# Patient Record
Sex: Female | Born: 1995 | Race: Black or African American | Hispanic: No | Marital: Single | Smoking: Never smoker
Health system: Southern US, Community
[De-identification: ages and names within clinical notes are randomized; demographics above are authoritative.]

## PROBLEM LIST (undated history)

## (undated) DIAGNOSIS — R7303 Prediabetes: Secondary | ICD-10-CM

## (undated) DIAGNOSIS — N911 Secondary amenorrhea: Secondary | ICD-10-CM

## (undated) DIAGNOSIS — E669 Obesity, unspecified: Secondary | ICD-10-CM

## (undated) HISTORY — DX: Obesity, unspecified: E66.9

## (undated) HISTORY — PX: TONSILLECTOMY AND ADENOIDECTOMY: SUR1326

## (undated) HISTORY — DX: Secondary amenorrhea: N91.1

## (undated) HISTORY — DX: Prediabetes: R73.03

---
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
## 2014-03-11 ENCOUNTER — Encounter (HOSPITAL_COMMUNITY): Payer: Self-pay | Admitting: *Deleted

## 2014-03-11 ENCOUNTER — Emergency Department (HOSPITAL_COMMUNITY)
Admission: EM | Admit: 2014-03-11 | Discharge: 2014-03-11 | Disposition: A | Discharge status: Home / Self Care | Payer: Medicaid Other | Attending: Emergency Medicine | Admitting: Emergency Medicine

## 2014-03-11 DIAGNOSIS — Y998 Other external cause status: Secondary | ICD-10-CM | POA: Insufficient documentation

## 2014-03-11 DIAGNOSIS — S8991XA Unspecified injury of right lower leg, initial encounter: Secondary | ICD-10-CM | POA: Insufficient documentation

## 2014-03-11 DIAGNOSIS — S3991XA Unspecified injury of abdomen, initial encounter: Secondary | ICD-10-CM | POA: Insufficient documentation

## 2014-03-11 DIAGNOSIS — Z793 Long term (current) use of hormonal contraceptives: Secondary | ICD-10-CM | POA: Insufficient documentation

## 2014-03-11 DIAGNOSIS — E669 Obesity, unspecified: Secondary | ICD-10-CM | POA: Insufficient documentation

## 2014-03-11 DIAGNOSIS — Y9389 Activity, other specified: Secondary | ICD-10-CM | POA: Insufficient documentation

## 2014-03-11 DIAGNOSIS — Z8742 Personal history of other diseases of the female genital tract: Secondary | ICD-10-CM | POA: Insufficient documentation

## 2014-03-11 DIAGNOSIS — Y92410 Unspecified street and highway as the place of occurrence of the external cause: Secondary | ICD-10-CM | POA: Insufficient documentation
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
## 2014-03-11 NOTE — Discharge Instructions (Signed)
Tylenol or motrin for pain.  Follow up as needed °
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
## 2016-02-13 DIAGNOSIS — Z309 Encounter for contraceptive management, unspecified: Secondary | ICD-10-CM | POA: Diagnosis not present

## 2016-02-13 DIAGNOSIS — Z6841 Body Mass Index (BMI) 40.0 and over, adult: Secondary | ICD-10-CM | POA: Diagnosis not present

## 2017-03-14 DIAGNOSIS — Z23 Encounter for immunization: Secondary | ICD-10-CM | POA: Diagnosis not present

## 2017-07-16 DIAGNOSIS — Z0000 Encounter for general adult medical examination without abnormal findings: Secondary | ICD-10-CM | POA: Diagnosis not present

## 2017-07-16 DIAGNOSIS — Z6841 Body Mass Index (BMI) 40.0 and over, adult: Secondary | ICD-10-CM | POA: Diagnosis not present

## 2017-11-19 DIAGNOSIS — Z0001 Encounter for general adult medical examination with abnormal findings: Secondary | ICD-10-CM | POA: Diagnosis not present

## 2017-11-19 DIAGNOSIS — Z0000 Encounter for general adult medical examination without abnormal findings: Secondary | ICD-10-CM | POA: Diagnosis not present

## 2019-03-18 DIAGNOSIS — Z20828 Contact with and (suspected) exposure to other viral communicable diseases: Secondary | ICD-10-CM | POA: Diagnosis not present

## 2019-06-01 ENCOUNTER — Ambulatory Visit: Payer: 59 | Attending: Internal Medicine

## 2019-06-01 ENCOUNTER — Other Ambulatory Visit: Payer: Self-pay

## 2019-06-01 DIAGNOSIS — Z20822 Contact with and (suspected) exposure to covid-19: Secondary | ICD-10-CM | POA: Insufficient documentation

## 2019-06-02 LAB — NOVEL CORONAVIRUS, NAA: SARS-CoV-2, NAA: NOT DETECTED

## 2019-10-16 ENCOUNTER — Ambulatory Visit
Admission: EM | Admit: 2019-10-16 | Discharge: 2019-10-16 | Disposition: A | Discharge status: Home / Self Care | Payer: 59 | Attending: Emergency Medicine | Admitting: Emergency Medicine

## 2019-10-16 ENCOUNTER — Encounter: Payer: Self-pay | Admitting: Emergency Medicine

## 2019-10-16 ENCOUNTER — Other Ambulatory Visit: Payer: Self-pay

## 2019-10-16 DIAGNOSIS — L0882 Omphalitis not of newborn: Secondary | ICD-10-CM | POA: Diagnosis present

## 2019-10-16 MED ORDER — VANCOMYCIN HCL 125 MG PO CAPS: 125.0000 mg | ORAL_CAPSULE | Freq: Four times a day (QID) | ORAL | 0 refills | Status: DC

## 2019-10-16 NOTE — ED Triage Notes (Signed)
Yellow/white/ greenish drainage from umbilicus x 4days.  Pt has used some antifungal cream and peroxide and it has not gotten any better.

## 2019-10-16 NOTE — ED Provider Notes (Signed)
[facility]   [id number] 10/16/19 Arrival Time: 1101   Chief Complaint  Patient presents with  . Abscess     SUBJECTIVE:  Joyce Jordan is a 24 y.o. female who presents the urgent care with a complaint of white drainage from umbilicus for the past 4 days.  Denies any precipitating event.  Has tried OTC antifungal cream and peroxide without relief.  Nothing make her symptoms worse.  Denies similar symptoms in the past.  Denies chills, fever, nausea, vomiting, diarrhea.    ROS: As per HPI.  All other pertinent ROS negative.     Past Medical History:  Diagnosis Date  . Obesity   . Prediabetes   . Secondary amenorrhea    Past Surgical History:  Procedure Laterality Date  . TONSILLECTOMY AND ADENOIDECTOMY     No Known Allergies No current facility-administered medications on file prior to encounter.   Current Outpatient Medications on File Prior to Encounter  Medication Sig Dispense Refill  . medroxyPROGESTERone (PROVERA) 10 MG tablet Take 1 tablet (10 mg total) by mouth daily. 1 pill daily x 14 days. 14 tablet 0   Social History   Socioeconomic History  . Marital status: Single    Spouse name: Not on file  . Number of children: Not on file  . Years of education: Not on file  . Highest education level: Not on file  Occupational History  . Not on file  Tobacco Use  . Smoking status: Passive Smoke Exposure - Never Smoker  . Smokeless tobacco: Never Used  Substance and Sexual Activity  . Alcohol use: No  . Drug use: No  . Sexual activity: Yes    Birth control/protection: None  Other Topics Concern  . Not on file  Social History Narrative   Is in 12th grade at [employer]. Lives with dad, grandmother   Social Determinants of Health   Financial Resource Strain:   . Difficulty of Paying Living Expenses:   Food Insecurity:   . Worried About Programme researcher, broadcasting/film/video in the Last Year:   . Barista in the Last Year:   Transportation Needs:     . Freight forwarder (Medical):   Marland Kitchen Lack of Transportation (Non-Medical):   Physical Activity:   . Days of Exercise per Week:   . Minutes of Exercise per Session:   Stress:   . Feeling of Stress :   Social Connections:   . Frequency of Communication with Friends and Family:   . Frequency of Social Gatherings with Friends and Family:   . Attends Religious Services:   . Active Member of Clubs or Organizations:   . Attends Banker Meetings:   Marland Kitchen Marital Status:   Intimate Partner Violence:   . Fear of Current or Ex-Partner:   . Emotionally Abused:   Marland Kitchen Physically Abused:   . Sexually Abused:    Family History  Problem Relation Age of Onset  . Thyroid disease Mother   . Hypertension Mother   . Obesity Mother   . Obesity Paternal Grandmother   . Obesity Father     OBJECTIVE:  Vitals:   10/16/19 1119 10/16/19 1121  BP:  (!) 140/93  Pulse:  (!) 101  Resp:  19  Temp:  98.9 F (37.2 C)  TempSrc:  Oral  SpO2:  97%  Weight: 280 lb (127 kg)   Height: 5\' 2"  (1.575 m)      Physical Exam Vitals and nursing note  reviewed.  Constitutional:      General: She is not in acute distress.    Appearance: Normal appearance. She is obese. She is not ill-appearing or toxic-appearing.  Cardiovascular:     Rate and Rhythm: Normal rate and regular rhythm.     Pulses: Normal pulses.     Heart sounds: Normal heart sounds. No murmur heard.  No gallop.   Pulmonary:     Effort: Pulmonary effort is normal. No respiratory distress.     Breath sounds: Normal breath sounds. No stridor. No wheezing, rhonchi or rales.  Chest:     Chest wall: No tenderness.  Skin:    General: Skin is warm and dry.     Findings: No ecchymosis, erythema, lesion or rash. Rash is not crusting or scaling.     Comments: White drainage from umbilicus.  Patient is obese unable to perform an abdomen present as to visualize the origin of the drainage.  Neurological:     Mental Status: She is alert.     Procedure:   ASSESSMENT & PLAN:  1. Omphalitis in adult     Meds ordered this encounter  Medications  . vancomycin (VANCOCIN) 125 MG capsule    Sig: Take 1 capsule (125 mg total) by mouth 4 (four) times daily.    Dispense:  10 capsule    Refill:  0   Patient is stable at discharge.  Currently there is no fever, however there is a concern of sepsis.  She was given the option to go to ED patient declined this time.  Vancomycin was prescribed.  Wound culture was completed.  Discharge instructions Wash site daily with warm water and mild soap Apply thin layer of Neosporin with Q-tips Keep covered to avoid friction Take antibiotic as prescribed and to completion Follow up here or with PCP if symptoms persists Return or go to the ED if you have any new or worsening symptoms increased redness, swelling, pain, nausea, vomiting, fever, chills, low blood pressure, higher heart rate, sepsis etc...    Reviewed expectations re: course of current medical issues. Questions answered. Outlined signs and symptoms indicating need for more acute intervention. Patient verbalized understanding. After Visit Summary given.          Emerson Monte, [place] 10/16/19 1210

## 2019-10-16 NOTE — Discharge Instructions (Addendum)
Wash site daily with warm water and mild soap Apply thin layer of Neosporin with Q-tips Keep covered to avoid friction Take antibiotic as prescribed and to completion Follow up here or with PCP if symptoms persists Return or go to the ED if you have any new or worsening symptoms increased redness, swelling, pain, nausea, vomiting, fever, chills, low blood pressure, higher heart rate, sepsis etc..Marland Kitchen

## 2019-10-17 ENCOUNTER — Telehealth: Payer: Self-pay | Admitting: Emergency Medicine

## 2019-10-17 MED ORDER — VANCOMYCIN HCL 250 MG PO CAPS: 250.0000 mg | ORAL_CAPSULE | Freq: Four times a day (QID) | ORAL | 0 refills | Status: AC

## 2019-10-17 NOTE — Telephone Encounter (Signed)
Antibiotic was sent to complete treatment

## 2019-10-19 ENCOUNTER — Telehealth (HOSPITAL_COMMUNITY): Payer: Self-pay | Admitting: Orthopedic Surgery

## 2019-10-19 LAB — AEROBIC CULTURE W GRAM STAIN (SUPERFICIAL SPECIMEN)

## 2019-12-02 ENCOUNTER — Encounter: Payer: Self-pay | Admitting: Adult Health

## 2019-12-02 ENCOUNTER — Ambulatory Visit (INDEPENDENT_AMBULATORY_CARE_PROVIDER_SITE_OTHER): Payer: 59 | Admitting: Adult Health

## 2019-12-02 ENCOUNTER — Other Ambulatory Visit (HOSPITAL_COMMUNITY)
Admission: RE | Admit: 2019-12-02 | Discharge: 2019-12-02 | Disposition: A | Discharge status: Home / Self Care | Payer: 59 | Source: Ambulatory Visit | Attending: Adult Health | Admitting: Adult Health

## 2019-12-02 VITALS — BP 145/98 | HR 97 | Ht 62.0 in | Wt 286.0 lb

## 2019-12-02 DIAGNOSIS — Z01419 Encounter for gynecological examination (general) (routine) without abnormal findings: Secondary | ICD-10-CM

## 2019-12-02 DIAGNOSIS — I10 Essential (primary) hypertension: Secondary | ICD-10-CM | POA: Diagnosis not present

## 2019-12-02 DIAGNOSIS — Z3202 Encounter for pregnancy test, result negative: Secondary | ICD-10-CM

## 2019-12-02 DIAGNOSIS — Z30011 Encounter for initial prescription of contraceptive pills: Secondary | ICD-10-CM | POA: Insufficient documentation

## 2019-12-02 LAB — POCT URINE PREGNANCY: Preg Test, Ur: NEGATIVE

## 2019-12-02 MED ORDER — NORETHINDRONE 0.35 MG PO TABS: 1.0000 | ORAL_TABLET | Freq: Every day | ORAL | 11 refills | Status: DC

## 2019-12-02 MED ORDER — LOSARTAN POTASSIUM-HCTZ 50-12.5 MG PO TABS: 1.0000 | ORAL_TABLET | Freq: Every day | ORAL | 6 refills | Status: DC

## 2019-12-02 NOTE — Patient Instructions (Signed)
DASH Eating Plan DASH stands for "Dietary Approaches to Stop Hypertension." The DASH eating plan is a healthy eating plan that has been shown to reduce high blood pressure (hypertension). It may also reduce your risk for type 2 diabetes, heart disease, and stroke. The DASH eating plan may also help with weight loss. What are tips for following this plan?  General guidelines  Avoid eating more than 2,300 mg (milligrams) of salt (sodium) a day. If you have hypertension, you may need to reduce your sodium intake to 1,500 mg a day.  Limit alcohol intake to no more than 1 drink a day for nonpregnant women and 2 drinks a day for men. One drink equals 12 oz of beer, 5 oz of wine, or 1 oz of hard liquor.  Work with your health care provider to maintain a healthy body weight or to lose weight. Ask what an ideal weight is for you.  Get at least 30 minutes of exercise that causes your heart to beat faster (aerobic exercise) most days of the week. Activities may include walking, swimming, or biking.  Work with your health care provider or diet and nutrition specialist (dietitian) to adjust your eating plan to your individual calorie needs. Reading food labels   Check food labels for the amount of sodium per serving. Choose foods with less than 5 percent of the Daily Value of sodium. Generally, foods with less than 300 mg of sodium per serving fit into this eating plan.  To find whole grains, look for the word "whole" as the first word in the ingredient list. Shopping  Buy products labeled as "low-sodium" or "no salt added."  Buy fresh foods. Avoid canned foods and premade or frozen meals. Cooking  Avoid adding salt when cooking. Use salt-free seasonings or herbs instead of table salt or sea salt. Check with your health care provider or pharmacist before using salt substitutes.  Do not fry foods. Cook foods using healthy methods such as baking, boiling, grilling, and broiling instead.  Cook with  heart-healthy oils, such as olive, canola, soybean, or sunflower oil. Meal planning  Eat a balanced diet that includes: ? 5 or more servings of fruits and vegetables each day. At each meal, try to fill half of your plate with fruits and vegetables. ? Up to 6-8 servings of whole grains each day. ? Less than 6 oz of lean meat, poultry, or fish each day. A 3-oz serving of meat is about the same size as a deck of cards. One egg equals 1 oz. ? 2 servings of low-fat dairy each day. ? A serving of nuts, seeds, or beans 5 times each week. ? Heart-healthy fats. Healthy fats called Omega-3 fatty acids are found in foods such as flaxseeds and coldwater fish, like sardines, salmon, and mackerel.  Limit how much you eat of the following: ? Canned or prepackaged foods. ? Food that is high in trans fat, such as fried foods. ? Food that is high in saturated fat, such as fatty meat. ? Sweets, desserts, sugary drinks, and other foods with added sugar. ? Full-fat dairy products.  Do not salt foods before eating.  Try to eat at least 2 vegetarian meals each week.  Eat more home-cooked food and less restaurant, buffet, and fast food.  When eating at a restaurant, ask that your food be prepared with less salt or no salt, if possible. What foods are recommended? The items listed may not be a complete list. Talk with your dietitian about   what dietary choices are best for you. Grains Whole-grain or whole-wheat bread. Whole-grain or whole-wheat pasta. Brown rice. Oatmeal. Quinoa. Bulgur. Whole-grain and low-sodium cereals. Pita bread. Low-fat, low-sodium crackers. Whole-wheat flour tortillas. Vegetables Fresh or frozen vegetables (raw, steamed, roasted, or grilled). Low-sodium or reduced-sodium tomato and vegetable juice. Low-sodium or reduced-sodium tomato sauce and tomato paste. Low-sodium or reduced-sodium canned vegetables. Fruits All fresh, dried, or frozen fruit. Canned fruit in natural juice (without  added sugar). Meat and other protein foods Skinless chicken or turkey. Ground chicken or turkey. Pork with fat trimmed off. Fish and seafood. Egg whites. Dried beans, peas, or lentils. Unsalted nuts, nut butters, and seeds. Unsalted canned beans. Lean cuts of beef with fat trimmed off. Low-sodium, lean deli meat. Dairy Low-fat (1%) or fat-free (skim) milk. Fat-free, low-fat, or reduced-fat cheeses. Nonfat, low-sodium ricotta or cottage cheese. Low-fat or nonfat yogurt. Low-fat, low-sodium cheese. Fats and oils Soft margarine without trans fats. Vegetable oil. Low-fat, reduced-fat, or light mayonnaise and salad dressings (reduced-sodium). Canola, safflower, olive, soybean, and sunflower oils. Avocado. Seasoning and other foods Herbs. Spices. Seasoning mixes without salt. Unsalted popcorn and pretzels. Fat-free sweets. What foods are not recommended? The items listed may not be a complete list. Talk with your dietitian about what dietary choices are best for you. Grains Baked goods made with fat, such as croissants, muffins, or some breads. Dry pasta or rice meal packs. Vegetables Creamed or fried vegetables. Vegetables in a cheese sauce. Regular canned vegetables (not low-sodium or reduced-sodium). Regular canned tomato sauce and paste (not low-sodium or reduced-sodium). Regular tomato and vegetable juice (not low-sodium or reduced-sodium). Pickles. Olives. Fruits Canned fruit in a light or heavy syrup. Fried fruit. Fruit in cream or butter sauce. Meat and other protein foods Fatty cuts of meat. Ribs. Fried meat. Bacon. Sausage. Bologna and other processed lunch meats. Salami. Fatback. Hotdogs. Bratwurst. Salted nuts and seeds. Canned beans with added salt. Canned or smoked fish. Whole eggs or egg yolks. Chicken or turkey with skin. Dairy Whole or 2% milk, cream, and half-and-half. Whole or full-fat cream cheese. Whole-fat or sweetened yogurt. Full-fat cheese. Nondairy creamers. Whipped toppings.  Processed cheese and cheese spreads. Fats and oils Butter. Stick margarine. Lard. Shortening. Ghee. Bacon fat. Tropical oils, such as coconut, palm kernel, or palm oil. Seasoning and other foods Salted popcorn and pretzels. Onion salt, garlic salt, seasoned salt, table salt, and sea salt. Worcestershire sauce. Tartar sauce. Barbecue sauce. Teriyaki sauce. Soy sauce, including reduced-sodium. Steak sauce. Canned and packaged gravies. Fish sauce. Oyster sauce. Cocktail sauce. Horseradish that you find on the shelf. Ketchup. Mustard. Meat flavorings and tenderizers. Bouillon cubes. Hot sauce and Tabasco sauce. Premade or packaged marinades. Premade or packaged taco seasonings. Relishes. Regular salad dressings. Where to find more information:  National Heart, Lung, and Blood Institute: www.nhlbi.nih.gov  American Heart Association: www.heart.org Summary  The DASH eating plan is a healthy eating plan that has been shown to reduce high blood pressure (hypertension). It may also reduce your risk for type 2 diabetes, heart disease, and stroke.  With the DASH eating plan, you should limit salt (sodium) intake to 2,300 mg a day. If you have hypertension, you may need to reduce your sodium intake to 1,500 mg a day.  When on the DASH eating plan, aim to eat more fresh fruits and vegetables, whole grains, lean proteins, low-fat dairy, and heart-healthy fats.  Work with your health care provider or diet and nutrition specialist (dietitian) to adjust your eating plan to your   individual calorie needs. This information is not intended to replace advice given to you by your health care provider. Make sure you discuss any questions you have with your health care provider. Document Revised: 03/29/2017 Document Reviewed: 04/09/2016 Elsevier Patient Education  2020 Elsevier Inc.  

## 2019-12-02 NOTE — Progress Notes (Signed)
Patient ID: Joyce Jordan, female   DOB: 1995-07-25, 24 y.o.   MRN: [id number] History of Present Illness: Joyce Jordan is a 24 year old black female,single, G0P0 in for a pelvic and first pap,she had a physical in January with Dr Felecia Shelling. PCP is Dr Felecia Shelling    Current Medications, Allergies, Past Medical History, Past Surgical History, Family History and Social History were reviewed in [employer] electronic medical record.     Review of Systems: Patient denies any headaches, hearing loss, fatigue, blurred vision, shortness of breath, chest pain, abdominal pain, problems with bowel movements, urination, or intercourse. No joint pain or mood swings.    Physical Exam:BP (!) 145/98 (BP Location: Right Wrist, Patient Position: Sitting, Cuff Size: Normal)    Pulse 97    Ht 5\' 2"  (1.575 m)    Wt 286 lb (129.7 kg)    LMP 11/11/2019    BMI 52.31 kg/m   BP was 137/108 in January at Dr February.  UPT is negative. General:  Well developed, well nourished, no acute distress Skin:  Warm and dry Neck:  Midline trachea, normal thyroid, good ROM, no lymphadenopathy Lungs; Clear to auscultation bilaterally Cardiovascular: Regular rate and rhythm Pelvic:  External genitalia is normal in appearance, no lesions.  The vagina is normal in appearance. Urethra has no lesions or masses. The cervix is nulliparous, pap with GC/CHL and high risk HPV genotyping performed.  Uterus is felt to be normal size, shape, and contour.  No adnexal masses or tenderness noted.Bladder is non tender, no masses felt. Extremities/musculoskeletal:  No swelling or varicosities noted, no clubbing or cyanosis Psych:  No mood changes, alert and cooperative,seems happy AA is 0 Fall risk is low PHQ 9 score is 3, no SI    Upstream - 12/02/19 02/01/20      Pregnancy Intention Screening   Does the patient want to become pregnant in the next year? No    Does the patient's partner want to become pregnant in the next year? No    Would the patient  like to discuss contraceptive options today? Yes      Contraception Wrap Up   Current Method No Method - Other Reason    End Method Oral Contraceptive    Contraception Counseling Provided Yes          Examination chaperoned by [id number] CMA.  Impression and Plan: 1. Urine pregnancy test negative   2. Encounter for gynecological examination with Papanicolaou smear of cervix Pap sent Physical with PCP and labs  Pap in 3 years if normal  3. Encounter for initial prescription of contraceptive pills Will rx POP due to BP,can start today,take at same time every day and use condoms for at least 1 pack Meds ordered this encounter  Medications   norethindrone (MICRONOR) 0.35 MG tablet    Sig: Take 1 tablet (0.35 mg total) by mouth daily.    Dispense:  28 tablet    Refill:  11    Order Specific Question:   Supervising Provider    Answer:   Federico Flake H [[id number]]   losartan-hydrochlorothiazide (HYZAAR) 50-12.5 MG tablet    Sig: Take 1 tablet by mouth daily.    Dispense:  30 tablet    Refill:  6    Order Specific Question:   Supervising Provider    Answer:   Duane Lope, LUTHER H [[id number]]    4. Hypertension, unspecified type Will rx hyzaar for BP  Discussed decreasing salt and sugars,review DASH diet Follow  up in 9 weeks for BP and weight check  5. Morbid obesity (HCC) Discussed trying to lose weight, cut out bread, potatoes, rice and pasta She ask about gastric by pass aware of [facility] in [place].

## 2019-12-04 LAB — CYTOLOGY - PAP
Chlamydia: POSITIVE — AB
Comment: NEGATIVE
Comment: NEGATIVE
Comment: NORMAL
Diagnosis: NEGATIVE
High risk HPV: NEGATIVE
Neisseria Gonorrhea: NEGATIVE

## 2019-12-07 ENCOUNTER — Encounter: Payer: Self-pay | Admitting: Adult Health

## 2019-12-07 ENCOUNTER — Telehealth: Payer: Self-pay | Admitting: Adult Health

## 2019-12-07 DIAGNOSIS — A749 Chlamydial infection, unspecified: Secondary | ICD-10-CM

## 2019-12-07 HISTORY — DX: Chlamydial infection, unspecified: A74.9

## 2019-12-07 MED ORDER — AZITHROMYCIN 500 MG PO TABS: ORAL_TABLET | ORAL | 0 refills | Status: DC

## 2019-12-07 NOTE — Telephone Encounter (Signed)
Pt aware pap negative for malignancy and HPV and GC but +chlamydia  Will treat with azithromycin 500 mg # 2 2 po now, no sex na PCO in 4 weeks [facility] sent Pt says no sex in over a year, contact person to be treated if can

## 2020-01-05 ENCOUNTER — Other Ambulatory Visit (HOSPITAL_COMMUNITY)
Admission: RE | Admit: 2020-01-05 | Discharge: 2020-01-05 | Disposition: A | Discharge status: Home / Self Care | Payer: 59 | Source: Ambulatory Visit | Attending: Adult Health | Admitting: Adult Health

## 2020-01-05 ENCOUNTER — Other Ambulatory Visit: Payer: Self-pay

## 2020-01-05 ENCOUNTER — Ambulatory Visit (INDEPENDENT_AMBULATORY_CARE_PROVIDER_SITE_OTHER): Payer: 59 | Admitting: Adult Health

## 2020-01-05 ENCOUNTER — Encounter: Payer: Self-pay | Admitting: Adult Health

## 2020-01-05 VITALS — BP 133/91 | HR 93 | Ht 62.0 in | Wt 284.0 lb

## 2020-01-05 DIAGNOSIS — I10 Essential (primary) hypertension: Secondary | ICD-10-CM

## 2020-01-05 DIAGNOSIS — Z3041 Encounter for surveillance of contraceptive pills: Secondary | ICD-10-CM | POA: Diagnosis not present

## 2020-01-05 DIAGNOSIS — A749 Chlamydial infection, unspecified: Secondary | ICD-10-CM

## 2020-01-05 MED ORDER — NORETHIN ACE-ETH ESTRAD-FE 1-20 MG-MCG PO TABS: 1.0000 | ORAL_TABLET | Freq: Every day | ORAL | 11 refills | Status: DC

## 2020-01-05 NOTE — Progress Notes (Signed)
°  Subjective:     Patient ID: Joyce Jordan, female   DOB: [birth year], 24 y.o.   MRN: [id number]  HPI Joyce Jordan is a 24 year old black female,single, G0P0 in for proof of cure, had +chlamydia on pap. No sex since treatment. She wants to go back on COCs, BP is better.  PCP is Dr Felecia Shelling   Review of Systems Denies any vaginal discharge, itching or odor. Reviewed past medical,surgical, social and family history. Reviewed medications and allergies.     Objective:   Physical Exam BP (!) 133/91 (BP Location: Left Arm, Patient Position: Sitting, Cuff Size: Normal)    Pulse 93    Ht 5\' 2"  (1.575 m)    Wt 284 lb (128.8 kg)    LMP 12/06/2019    BMI 51.94 kg/m  Skin warm and dry.Pelvic: external genitalia is normal in appearance no lesions, vagina: scant discharge without odor,urethra has no lesions or masses noted, cervix:smooth, uterus: normal size, shape and contour, non tender, no masses felt, adnexa: no masses or tenderness noted. Bladder is non tender and no masses felt. CV swab obtained for GC/CHL. Examination chaperoned by 02/05/2020 LPN     Upstream - 01/05/20 0949      Pregnancy Intention Screening   Does the patient want to become pregnant in the next year? No    Does the patient's partner want to become pregnant in the next year? No    Would the patient like to discuss contraceptive options today? Yes      Contraception Wrap Up   Current Method Female Condom    End Method Oral Contraceptive    Contraception Counseling Provided Yes          Assessment:     1. Chlamydia CV swab sent   2. Hypertension, unspecified type Continue hyzaar, has refills  3. Morbid obesity (HCC) Try to lose a pound a week  4. Encounter for surveillance of contraceptive pills Finish current pack of OCs, then start junel 1/20, use condoms   Meds ordered this encounter  Medications   norethindrone-ethinyl estradiol (LOESTRIN FE) 1-20 MG-MCG tablet    Sig: Take 1 tablet by mouth daily.     Dispense:  28 tablet    Refill:  11    Order Specific Question:   Supervising Provider    Answer:   2/20 [[id number]]      Plan:     Follow up in 3 months of OCs and BP

## 2020-01-06 LAB — CERVICOVAGINAL ANCILLARY ONLY
Chlamydia: NEGATIVE
Comment: NEGATIVE
Comment: NORMAL
Neisseria Gonorrhea: NEGATIVE

## 2020-02-03 ENCOUNTER — Ambulatory Visit: Payer: 59 | Admitting: Adult Health

## 2020-02-11 ENCOUNTER — Encounter: Payer: Self-pay | Admitting: Adult Health

## 2020-02-11 ENCOUNTER — Ambulatory Visit (INDEPENDENT_AMBULATORY_CARE_PROVIDER_SITE_OTHER): Payer: 59 | Admitting: Adult Health

## 2020-02-11 VITALS — BP 131/83 | HR 88 | Ht 62.0 in | Wt 284.0 lb

## 2020-02-11 DIAGNOSIS — I10 Essential (primary) hypertension: Secondary | ICD-10-CM | POA: Diagnosis not present

## 2020-02-11 DIAGNOSIS — N926 Irregular menstruation, unspecified: Secondary | ICD-10-CM | POA: Diagnosis not present

## 2020-02-11 NOTE — Progress Notes (Signed)
  Subjective:     Patient ID: Joyce Jordan, female   DOB: [birth year], 24 y.o.   MRN: [id number]  HPI Joyce Jordan is a 24 year old black female,single, G0P0, in for BP and weight check. She has been to [facility].  PCP is Dr Felecia Shelling   Review of Systems Period is late Reviewed past medical,surgical, social and family history. Reviewed medications and allergies.     Objective:   Physical Exam BP 131/83 (BP Location: Left Arm, Patient Position: Sitting, Cuff Size: Normal)   Pulse 88   Ht 5\' 2"  (1.575 m)   Wt 284 lb (128.8 kg)   LMP 01/08/2020 (Approximate)   BMI 51.94 kg/m  Skin warm and dry. Lungs: clear to ausculation bilaterally. Cardiovascular: regular rate and rhythm.  Upstream - 02/11/20 1220      Pregnancy Intention Screening   Does the patient want to become pregnant in the next year? No    Would the patient like to discuss contraceptive options today? No      Contraception Wrap Up   Current Method Oral Contraceptive    End Method Oral Contraceptive    Contraception Counseling Provided No             Assessment:     1. Hypertension, unspecified type Continue Hyzaar 50-12.5 mg,has refills  2. Morbid obesity (HCC) Keep trying to lose weight  Walk more   3. Menstrual period late Continue OCs, has refills     Plan:     Follow up in 3 months for BP and weight check

## 2020-04-05 ENCOUNTER — Ambulatory Visit (INDEPENDENT_AMBULATORY_CARE_PROVIDER_SITE_OTHER): Payer: 59 | Admitting: Adult Health

## 2020-04-05 ENCOUNTER — Other Ambulatory Visit: Payer: Self-pay

## 2020-04-05 ENCOUNTER — Encounter: Payer: Self-pay | Admitting: Adult Health

## 2020-04-05 VITALS — BP 118/75 | HR 85 | Ht 63.0 in | Wt 284.0 lb

## 2020-04-05 DIAGNOSIS — Z3041 Encounter for surveillance of contraceptive pills: Secondary | ICD-10-CM

## 2020-04-05 DIAGNOSIS — I10 Essential (primary) hypertension: Secondary | ICD-10-CM

## 2020-04-05 NOTE — Progress Notes (Signed)
  Subjective:     Patient ID: Joyce Jordan, female   DOB: 1995/07/16, 24 y.o.   MRN: [id number]  HPI Joyce Jordan is a 24 year old black female,single, G0P0, back in follow up on BP and weight. She did go to weight loss seminar, but has not heard from clinic yet. PCP is Dr Felecia Shelling.  Review of Systems Patient denies any headaches, hearing loss, fatigue, blurred vision, shortness of breath, chest pain, abdominal pain, problems with bowel movements, urination, or intercourse(not active). No joint pain or mood swings. Periods regular on OCs.    Reviewed past medical,surgical, social and family history. Reviewed medications and allergies.  Objective:   Physical Exam BP 118/75 (BP Location: Left Arm, Patient Position: Sitting, Cuff Size: Large)   Pulse 85   Ht 5\' 3"  (1.6 m)   Wt 284 lb (128.8 kg)   LMP 03/16/2020   BMI 50.31 kg/m  Skin warm and dry.Lungs: clear to ausculation bilaterally. Cardiovascular: regular rate and rhythm. Her BP is much better and did not gain and weight same as 3 months ago.   Upstream - 04/05/20 0920      Pregnancy Intention Screening   Does the patient want to become pregnant in the next year? No    Does the patient's partner want to become pregnant in the next year? No    Would the patient like to discuss contraceptive options today? No      Contraception Wrap Up   Current Method Oral Contraceptive    End Method Oral Contraceptive    Contraception Counseling Provided No             Assessment:     1. Hypertension, unspecified type Continue Hyzaar, has refills   2. Morbid obesity (HCC) Call weight loss clinic back  3. Encounter for surveillance of contraceptive pills Continue Loestrin 1-20 has refills     Plan:     Follow up in 3 months for BP check and ROS on weight loss efforts

## 2020-06-23 ENCOUNTER — Other Ambulatory Visit: Payer: Self-pay | Admitting: Adult Health

## 2020-07-07 ENCOUNTER — Ambulatory Visit: Payer: 59 | Admitting: Adult Health

## 2020-07-28 ENCOUNTER — Other Ambulatory Visit: Payer: Self-pay

## 2020-07-28 ENCOUNTER — Other Ambulatory Visit: Payer: Self-pay | Admitting: Adult Health

## 2020-07-28 ENCOUNTER — Encounter: Payer: Self-pay | Admitting: Adult Health

## 2020-07-28 ENCOUNTER — Ambulatory Visit (INDEPENDENT_AMBULATORY_CARE_PROVIDER_SITE_OTHER): Payer: 59 | Admitting: Adult Health

## 2020-07-28 VITALS — BP 125/92 | HR 87 | Ht 62.0 in | Wt 285.5 lb

## 2020-07-28 DIAGNOSIS — I10 Essential (primary) hypertension: Secondary | ICD-10-CM

## 2020-07-28 DIAGNOSIS — Z3041 Encounter for surveillance of contraceptive pills: Secondary | ICD-10-CM

## 2020-07-28 MED ORDER — LOSARTAN POTASSIUM-HCTZ 50-12.5 MG PO TABS: ORAL_TABLET | ORAL | 1 refills | Status: DC

## 2020-07-28 NOTE — Progress Notes (Signed)
  Subjective:     Patient ID: Joyce Jordan, female   DOB: October 03, 1995, 25 y.o.   MRN: [id number]  HPI Joyce Jordan is a 25 year old black female,single, G0P0 back in follow up on weight and BP and periods. She stopped BP last week BP was low, and PCP told her to check BP before taking her BP pill. Periods are good, and has not heard back from Weight loss center. Last pap was negative for malignancy 12/22/19.   PCP is Dr Felecia Shelling.  Review of Systems Periods good BP has been low. Reviewed past medical,surgical, social and family history. Reviewed medications and allergies.     Objective:   Physical Exam BP (!) 125/92 (BP Location: Left Arm, Patient Position: Sitting, Cuff Size: Large)   Pulse 87   Ht 5\' 2"  (1.575 m)   Wt 285 lb 8 oz (129.5 kg)   LMP 07/08/2020 (Approximate)   BMI 52.22 kg/m  Skin warm and dry. Lungs: clear to ausculation bilaterally. Cardiovascular: regular rate and rhythm. She gained 1.5 lbs Fall risk is low  Upstream - 07/28/20 1149      Pregnancy Intention Screening   Does the patient want to become pregnant in the next year? No    Does the patient's partner want to become pregnant in the next year? No    Would the patient like to discuss contraceptive options today? No      Contraception Wrap Up   Current Method Oral Contraceptive    End Method Oral Contraceptive    Contraception Counseling Provided No             Assessment:     1. Hypertension, unspecified type Will try 1/2 tablet hyzaar,and keep check on BP Meds ordered this encounter  Medications  . losartan-hydrochlorothiazide (HYZAAR) 50-12.5 MG tablet    Sig: Take 1/2 tablet in am    Dispense:  30 tablet    Refill:  1    ZERO refills remain on this prescription. Your patient is requesting advance approval of refills for this medication to PREVENT ANY MISSED DOSES    Order Specific Question:   Supervising Provider    Answer:   07/30/20, LUTHER H [[id number]]    2. Morbid obesity (HCC) Call weight loss  center  3. Encounter for surveillance of contraceptive pills Continue OCs    Plan:     Follow up in 8 weeks

## 2020-09-16 ENCOUNTER — Other Ambulatory Visit: Payer: Self-pay | Admitting: Adult Health

## 2020-09-22 ENCOUNTER — Ambulatory Visit: Payer: 59 | Admitting: Adult Health

## 2020-10-11 ENCOUNTER — Ambulatory Visit: Payer: Self-pay | Admitting: "Endocrinology

## 2020-10-15 ENCOUNTER — Other Ambulatory Visit: Payer: Self-pay | Admitting: Adult Health

## 2020-12-19 ENCOUNTER — Other Ambulatory Visit: Payer: Self-pay | Admitting: Adult Health

## 2021-02-02 ENCOUNTER — Ambulatory Visit (INDEPENDENT_AMBULATORY_CARE_PROVIDER_SITE_OTHER): Payer: 59 | Admitting: Adult Health

## 2021-02-02 ENCOUNTER — Encounter: Payer: Self-pay | Admitting: Adult Health

## 2021-02-02 ENCOUNTER — Other Ambulatory Visit: Payer: Self-pay

## 2021-02-02 VITALS — BP 128/88 | HR 94 | Ht 62.0 in | Wt 295.0 lb

## 2021-02-02 DIAGNOSIS — K141 Geographic tongue: Secondary | ICD-10-CM | POA: Diagnosis not present

## 2021-02-02 DIAGNOSIS — I10 Essential (primary) hypertension: Secondary | ICD-10-CM

## 2021-02-02 MED ORDER — MAGIC MOUTHWASH: 5.0000 mL | Freq: Three times a day (TID) | ORAL | 0 refills | Status: DC | PRN

## 2021-02-02 NOTE — Progress Notes (Signed)
  Subjective:     Patient ID: Joyce Jordan, female   DOB: [birth year], 25 y.o.   MRN: [id number]  HPI Joyce Jordan is a 25 year old black female,single, G0P0, back in for BP check and doing well, but has soreness in tongue at times, and has ridges, has seen PCP  and he told her to see me. PCP is Dr Felecia Shelling. Lab Results  Component Value Date   DIAGPAP  12/02/2019    - Negative for intraepithelial lesion or malignancy (NILM)   HPVHIGH Negative 12/02/2019    Review of Systems Has soreness and ridges on tongue Reviewed past medical,surgical, social and family history. Reviewed medications and allergies.     Objective:   Physical Exam BP 128/88 (BP Location: Right Arm, Patient Position: Sitting, Cuff Size: Normal)   Pulse 94   Ht 5\' 2"  (1.575 m)   Wt 295 lb (133.8 kg)   LMP 01/20/2021   BMI 53.96 kg/m     Skin warm and dry. Tongue, has ridges, no blisters or ulcers. Lungs: clear to ausculation bilaterally. Cardiovascular: regular rate and rhythm.   Upstream - 02/02/21 1530       Pregnancy Intention Screening   Does the patient want to become pregnant in the next year? No    Does the patient's partner want to become pregnant in the next year? No    Would the patient like to discuss contraceptive options today? No      Contraception Wrap Up   Current Method Oral Contraceptive    End Method Oral Contraceptive    Contraception Counseling Provided No             Assessment:     1. Hypertension, unspecified type Continue Hyzaar, has refills   2. Geographic tongue Will try magia mouth was Meds ordered this encounter  Medications   magic mouthwash SOLN    Sig: Take 5 mLs by mouth 3 (three) times daily as needed for mouth pain.    Dispense:  60 mL    Refill:  0    Order Specific Question:   Supervising Provider    Answer:   04/04/21 [[id number]]       Plan:     Follow up in 2 weeks if not better refer to ENT

## 2021-02-16 ENCOUNTER — Ambulatory Visit: Payer: 59 | Admitting: Adult Health

## 2021-03-02 ENCOUNTER — Encounter: Payer: Self-pay | Admitting: Adult Health

## 2021-03-02 ENCOUNTER — Ambulatory Visit (INDEPENDENT_AMBULATORY_CARE_PROVIDER_SITE_OTHER): Payer: 59 | Admitting: Adult Health

## 2021-03-02 ENCOUNTER — Other Ambulatory Visit: Payer: Self-pay

## 2021-03-02 VITALS — BP 113/74 | HR 74 | Ht 63.0 in | Wt 296.4 lb

## 2021-03-02 DIAGNOSIS — I10 Essential (primary) hypertension: Secondary | ICD-10-CM

## 2021-03-02 DIAGNOSIS — K141 Geographic tongue: Secondary | ICD-10-CM | POA: Diagnosis not present

## 2021-03-02 MED ORDER — LOSARTAN POTASSIUM-HCTZ 100-25 MG PO TABS: 1.0000 | ORAL_TABLET | Freq: Every day | ORAL | 6 refills | Status: DC

## 2021-03-02 NOTE — Progress Notes (Signed)
  Subjective:     Patient ID: Joyce Jordan, female   DOB: 09-Feb-1996, 25 y.o.   MRN: [id number]  HPI Joyce Jordan is a 25 year old black female,single, G0P0, back in follow up on BP and tongue, and tongue feels better when uses magic mouthwash, but has had ridges. PCP is Dr Felecia Shelling. Lab Results  Component Value Date   DIAGPAP  12/02/2019    - Negative for intraepithelial lesion or malignancy (NILM)   HPVHIGH Negative 12/02/2019    Review of Systems Still has ridges on tongue Reviewed past medical,surgical, social and family history. Reviewed medications and allergies.     Objective:   Physical Exam BP 113/74 (BP Location: Right Arm, Patient Position: Sitting, Cuff Size: Normal)   Pulse 74   Ht 5\' 3"  (1.6 m)   Wt 296 lb 6.4 oz (134.4 kg)   LMP 02/12/2021 (Exact Date)   BMI 52.50 kg/m  Skin warm and dry. Tongue has ridges. Lungs: clear to ausculation bilaterally. Cardiovascular: regular rate and rhythm.      Upstream - 03/02/21 1415       Pregnancy Intention Screening   Does the patient want to become pregnant in the next year? No    Does the patient's partner want to become pregnant in the next year? No    Would the patient like to discuss contraceptive options today? No      Contraception Wrap Up   Current Method Oral Contraceptive    End Method Oral Contraceptive    Contraception Counseling Provided No             Assessment:     1. Hypertension, unspecified type, BP better Will continue meds Meds ordered this encounter  Medications   losartan-hydrochlorothiazide (HYZAAR) 100-25 MG tablet    Sig: Take 1 tablet by mouth daily.    Dispense:  30 tablet    Refill:  6    Order Specific Question:   Supervising Provider    Answer:   13/03/22, LUTHER H [[id number]]     2. Geographic tongue Will refer to ENT    Orders Placed This Encounter  Procedures   Ambulatory referral to ENT    Referral Priority:   Routine    Referral Type:   Consultation    Referral Reason:   Specialty  Services Required    Requested Specialty:   Otolaryngology    Number of Visits Requested:   1    Plan:     Follow up in 6 months

## 2021-03-30 ENCOUNTER — Telehealth: Payer: Self-pay | Admitting: Adult Health

## 2021-03-30 DIAGNOSIS — K141 Geographic tongue: Secondary | ICD-10-CM

## 2021-03-30 NOTE — Telephone Encounter (Signed)
Pt called, she's concerned due to she's not heard from the ENT we referred her to Looked in chart - we referred to Dr. Ezzard Standing & he's retired Can we place a new referral & send to a different ENT?   Please advise & notify pt when our part is done

## 2021-03-31 NOTE — Telephone Encounter (Signed)
Called pt, notified that referral was sent to Dr. Suszanne Conners

## 2021-03-31 NOTE — Telephone Encounter (Signed)
Will refer to Dr Suszanne Conners

## 2021-04-03 ENCOUNTER — Ambulatory Visit
Admission: EM | Admit: 2021-04-03 | Discharge: 2021-04-03 | Disposition: A | Discharge status: Home / Self Care | Payer: 59 | Attending: Physician Assistant | Admitting: Physician Assistant

## 2021-04-03 ENCOUNTER — Encounter: Payer: Self-pay | Admitting: Emergency Medicine

## 2021-04-03 ENCOUNTER — Other Ambulatory Visit: Payer: Self-pay

## 2021-04-03 DIAGNOSIS — G629 Polyneuropathy, unspecified: Secondary | ICD-10-CM

## 2021-04-03 LAB — POCT FASTING CBG KUC MANUAL ENTRY: POCT Glucose (KUC): 96 mg/dL (ref 70–99)

## 2021-04-03 NOTE — Discharge Instructions (Signed)
Schedule to see your Physician for a complete check up

## 2021-04-03 NOTE — ED Triage Notes (Signed)
Pt reports left leg numbness/tingling x3-4 days. Pt denies any known injury or past history of same. Pt reports woke up one morning and reports "left leg cold sensation". Ambulation with steady gait.

## 2021-04-07 NOTE — ED Provider Notes (Signed)
[facility]    CSN: [id number] Arrival date & time: 04/03/21  1357      History   Chief Complaint Chief Complaint  Patient presents with   Leg Pain    HPI Joyce Jordan is a 25 y.o. female.   Pt complains of a tingling sensation to her left leg.  Pt denies any injury. No back pain   The history is provided by the patient. The history is limited by a language barrier. No language interpreter was used.  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   Menstrual period late 02/11/2020   Encounter for surveillance of contraceptive pills 01/05/2020   Chlamydia 12/07/2019   Urine pregnancy test negative 12/02/2019   Encounter for gynecological examination with Papanicolaou smear of cervix 12/02/2019   Encounter for initial prescription of contraceptive pills 12/02/2019   Hypertension 12/02/2019   Morbid obesity (HCC) 12/02/2019   Secondary amenorrhea 05/19/2012   Acanthosis 05/19/2012   Obesity    Prediabetes     Past Surgical History:  Procedure Laterality Date   TONSILLECTOMY AND ADENOIDECTOMY      OB History     Gravida  0   Para  0   Term  0   Preterm  0   AB  0   Living  0      SAB  0   IAB  0   Ectopic  0   Multiple  0   Live Births  0            Home Medications    Prior to Admission medications   Medication Sig Start Date End Date Taking? Authorizing Provider  AUROVELA FE 1/20 1-20 MG-MCG tablet TAKE 1 TABLET BY MOUTH DAILY Patient not taking: Reported on 04/03/2021 12/19/20   Cyril Mourning A, NP   ibuprofen (ADVIL) 800 MG tablet Take 800 mg by mouth every 8 (eight) hours as needed. Patient not taking: Reported on 04/03/2021 09/16/20   [provider]  losartan-hydrochlorothiazide (HYZAAR) 100-25 MG tablet Take 1 tablet by mouth daily. 03/02/21   Adline Potter, NP  magic mouthwash SOLN Take 5 mLs by mouth 3 (three) times daily as needed for mouth pain. Patient not taking: Reported on 04/03/2021 02/02/21   Adline Potter, NP    Family History Family History  Problem Relation Age of Onset   Thyroid disease Mother    Hypertension Mother    Obesity Mother    Obesity Paternal Grandmother    Obesity Father     Social History Social History   Tobacco Use   Smoking status: Never    Passive exposure: Yes   Smokeless tobacco: Never  Vaping Use   Vaping Use: Never used  Substance Use Topics   Alcohol use: No   Drug use: No     Allergies   Patient has no known allergies.   Review of Systems Review of Systems  All other systems reviewed  and are negative.   Physical Exam Triage Vital Signs ED Triage Vitals [04/03/21 1416]  Enc Vitals Group     BP 128/87     Pulse Rate 91     Resp 18     Temp 98.4 F (36.9 C)     Temp Source Oral     SpO2 98 %     Weight 300 lb (136.1 kg)     Height 5\' 3"  (1.6 m)     Head Circumference      Peak Flow      Pain Score 0     Pain Loc      Pain Edu?      Excl. in GC?    No data found.  Updated Vital Signs BP 128/87 (BP Location: Right Arm)   Pulse 91   Temp 98.4 F (36.9 C) (Oral)   Resp 18   Ht 5\' 3"  (1.6 m)   Wt 136.1 kg   LMP 03/18/2021 (Approximate)   SpO2 98%   BMI 53.14 kg/m   Visual Acuity Right Eye Distance:   Left Eye Distance:   Bilateral Distance:    Right Eye Near:   Left Eye Near:    Bilateral Near:     Physical Exam Vitals and nursing note reviewed.  Constitutional:      Appearance: She is well-developed.  HENT:     Head: Normocephalic.  Cardiovascular:     Rate and Rhythm:  Normal rate.  Pulmonary:     Effort: Pulmonary effort is normal.  Abdominal:     General: There is no distension.  Musculoskeletal:        General: Normal range of motion.     Cervical back: Normal range of motion.  Skin:    General: Skin is warm.     Capillary Refill: Capillary refill takes less than 2 seconds.  Neurological:     Mental Status: She is alert and oriented to person, place, and time.     UC Treatments / Results  Labs (all labs ordered are listed, but only abnormal results are displayed) Labs Reviewed  POCT FASTING [facility] MANUAL ENTRY    EKG   Radiology No results found.  Procedures Procedures (including critical care time)  Medications Ordered in UC Medications - No data to display  Initial Impression / Assessment and Plan / UC Course  I have reviewed the triage vital signs and the nursing notes.  Pertinent labs & imaging results that were available during my care of the patient were reviewed by me and considered in my medical decision making (see chart for details).     MDM: Pt has normal exam.  Pt advised to follow up with primary care  Final Clinical Impressions(s) / UC Diagnoses   Final diagnoses:  Neuropathy     Discharge Instructions      Schedule to see your Physician for a complete check up    ED Prescriptions   None    PDMP not reviewed this encounter.   , 03/20/2021 04/07/21 [place]

## 2021-05-12 ENCOUNTER — Telehealth: Payer: Self-pay | Admitting: Adult Health

## 2021-05-12 DIAGNOSIS — K141 Geographic tongue: Secondary | ICD-10-CM

## 2021-05-12 NOTE — Telephone Encounter (Signed)
Referral sent 

## 2021-05-12 NOTE — Telephone Encounter (Signed)
Patient called saying the office she was referred to doesn't accept her insurance she has now. Please advise.

## 2021-05-12 NOTE — Telephone Encounter (Signed)
Patient can be referred to [facility].

## 2021-05-12 NOTE — Telephone Encounter (Signed)
Pt was being referred to ENT dr and they don't accept her insurance. Pt has Monia Pouch now. Pt was advised to call her insurance company and ask what ENT dr her insurance covers. Pt was advised to call us back with that info. Pt voiced understanding. JSY

## 2021-05-15 NOTE — Telephone Encounter (Signed)
Pt aware [facility] sent in referral so she should be getting a call from that office. Pt voiced understanding. JSY

## 2021-05-22 ENCOUNTER — Telehealth: Payer: Self-pay

## 2021-05-22 ENCOUNTER — Ambulatory Visit
Admission: EM | Admit: 2021-05-22 | Discharge: 2021-05-22 | Disposition: A | Discharge status: Home / Self Care | Payer: 59 | Attending: Family Medicine | Admitting: Family Medicine

## 2021-05-22 ENCOUNTER — Other Ambulatory Visit: Payer: Self-pay

## 2021-05-22 DIAGNOSIS — J069 Acute upper respiratory infection, unspecified: Secondary | ICD-10-CM | POA: Diagnosis not present

## 2021-05-22 DIAGNOSIS — Z20822 Contact with and (suspected) exposure to covid-19: Secondary | ICD-10-CM

## 2021-05-22 LAB — POCT RAPID STREP A (OFFICE): Rapid Strep A Screen: NEGATIVE

## 2021-05-22 MED ORDER — FLUTICASONE PROPIONATE 50 MCG/ACT NA SUSP: 1.0000 | Freq: Two times a day (BID) | NASAL | 0 refills | Status: DC

## 2021-05-22 NOTE — ED Provider Notes (Signed)
[facility]    CSN: [id number] Arrival date & time: 05/22/21  1114      History   Chief Complaint Chief Complaint  Patient presents with   Sore Throat    Fever and sore throat   HPI [name] is a 26 y.o. female.   Presenting today with 1 to 2-day history of nasal congestion, fever, chills, aches, sweats, loss of taste and smell, fatigue.  Denies chest pain, shortness of breath, abdominal pain, nausea vomiting or diarrhea.  So far trying NyQuil and DayQuil and Benadryl with no relief.  Multiple sick contacts at work.  No known past history of chronic pulmonary disease.   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   Menstrual period late 02/11/2020   Encounter for surveillance of contraceptive pills 01/05/2020   Chlamydia 12/07/2019   Urine pregnancy test negative 12/02/2019   Encounter for gynecological examination with Papanicolaou smear of cervix 12/02/2019   Encounter for initial prescription of contraceptive pills 12/02/2019   Hypertension 12/02/2019   Morbid obesity (HCC) 12/02/2019   Secondary amenorrhea 05/19/2012   Acanthosis 05/19/2012   Obesity    Prediabetes    Past Surgical History:  Procedure Laterality Date   TONSILLECTOMY AND ADENOIDECTOMY      OB History     Gravida  0   Para  0   Term  0   Preterm  0   AB  0   Living  0      SAB  0   IAB  0   Ectopic  0   Multiple  0   Live Births  0          Home Medications    Prior to Admission medications   Medication Sig Start Date End Date Taking? Authorizing Provider  fluticasone (FLONASE) 50 MCG/ACT nasal spray Place 1 spray into both nostrils 2 (two) times daily. 05/22/21  Yes Particia Nearing, PA-C  AUROVELA FE 1/20 1-20 MG-MCG tablet TAKE 1 TABLET BY MOUTH DAILY Patient not  taking: Reported on 04/03/2021 12/19/20   Cyril Mourning A, NP  ibuprofen (ADVIL) 800 MG tablet Take 800 mg by mouth every 8 (eight) hours as needed. Patient not taking: Reported on 04/03/2021 09/16/20   [provider]  losartan-hydrochlorothiazide (HYZAAR) 100-25 MG tablet Take 1 tablet by mouth daily. 03/02/21   Adline Potter, NP  magic mouthwash SOLN Take 5 mLs by mouth 3 (three) times daily as needed for mouth pain. Patient not taking: Reported on 04/03/2021 02/02/21   Adline Potter, NP   Family History Family History  Problem Relation Age of Onset   Thyroid disease Mother    Hypertension Mother    Obesity Mother    Obesity Paternal Grandmother    Obesity Father     Social History Social History   Tobacco Use   Smoking status: Never    Passive exposure: Yes   Smokeless tobacco: Never  Vaping Use   Vaping Use: Never used  Substance Use Topics   Alcohol use: Yes    Comment: Occas   Drug use: No     Allergies   Patient has no known allergies.   Review of Systems Review of Systems Per HPI  Physical Exam Triage Vital Signs ED  Triage Vitals  Enc Vitals Group     BP 05/22/21 1230 121/86     Pulse Rate 05/22/21 1230 86     Resp 05/22/21 1230 18     Temp 05/22/21 1230 98.1 F (36.7 C)     Temp Source 05/22/21 1230 Oral     SpO2 05/22/21 1230 95 %     Weight --      Height --      Head Circumference --      Peak Flow --      Pain Score 05/22/21 1227 0     Pain Loc --      Pain Edu? --      Excl. in GC? --    No data found.  Updated Vital Signs BP 121/86 (BP Location: Right Arm)    Pulse 86    Temp 98.1 F (36.7 C) (Oral)    Resp 18    LMP 05/10/2021 (Exact Date)    SpO2 95%   Visual Acuity Right Eye Distance:   Left Eye Distance:   Bilateral Distance:    Right Eye Near:   Left Eye Near:    Bilateral Near:     Physical Exam Vitals and nursing note reviewed.  Constitutional:      Appearance: Normal appearance. She is not  ill-appearing.  HENT:     Head: Atraumatic.     Right Ear: Tympanic membrane and external ear normal.     Left Ear: Tympanic membrane and external ear normal.     Nose: Rhinorrhea present.     Mouth/Throat:     Mouth: Mucous membranes are moist.     Pharynx: Posterior oropharyngeal erythema present.  Eyes:     Extraocular Movements: Extraocular movements intact.     Conjunctiva/sclera: Conjunctivae normal.  Cardiovascular:     Rate and Rhythm: Normal rate and regular rhythm.     Heart sounds: Normal heart sounds.  Pulmonary:     Effort: Pulmonary effort is normal.     Breath sounds: Normal breath sounds. No wheezing or rales.  Abdominal:     General: Bowel sounds are normal. There is no distension.     Palpations: Abdomen is soft.     Tenderness: There is no abdominal tenderness. There is no right CVA tenderness, left CVA tenderness or guarding.  Musculoskeletal:        General: Normal range of motion.     Cervical back: Normal range of motion and neck supple.  Skin:    General: Skin is warm and dry.  Neurological:     Mental Status: She is alert and oriented to person, place, and time.  Psychiatric:        Mood and Affect: Mood normal.        Thought Content: Thought content normal.        Judgment: Judgment normal.   UC Treatments / Results  Labs (all labs ordered are listed, but only abnormal results are displayed) Labs Reviewed  COVID-19, FLU A+B NAA  POCT RAPID STREP A (OFFICE)   EKG  Radiology No results found.  Procedures Procedures (including critical care time)  Medications Ordered in UC Medications - No data to display  Initial Impression / Assessment and Plan / UC Course  I have reviewed the triage vital signs and the nursing notes.  Pertinent labs & imaging results that were available during my care of the patient were reviewed by me and considered in my medical decision making (see chart for  details).     Vital signs benign and reassuring,  suspect viral upper respiratory infection causing symptoms.  COVID and flu testing pending.  Treat with Flonase, supportive over-the-counter medications and home care.  Strep test was negative.  Return for acutely worsening symptoms.  Final Clinical Impressions(s) / UC Diagnoses   Final diagnoses:  Exposure to COVID-19 virus  Viral URI with cough   Discharge Instructions   None    ED Prescriptions     Medication Sig Dispense Auth. Provider   fluticasone (FLONASE) 50 MCG/ACT nasal spray Place 1 spray into both nostrils 2 (two) times daily. 16 g Particia NearingLane, Irini Leet [place], [place]-C      PDMP not reviewed this encounter.   Particia NearingLane, Zakira Ressel [place], [place] 05/22/21 [id number]

## 2021-05-22 NOTE — Telephone Encounter (Signed)
Pt called and stated that she changed insurances and need to change pharmacy [facility]

## 2021-05-22 NOTE — Telephone Encounter (Signed)
Pt's pharmacy [facility] in [place]. JSY

## 2021-05-22 NOTE — ED Triage Notes (Signed)
Patient states that she contracted something from someone at work  Patient states she is unable smell and taste.   Patient states she has a clogged nose with fever  Patient states she has tried Nyquil and Dayquil and benadryl without any relief.

## 2021-05-23 LAB — COVID-19, FLU A+B NAA
Influenza A, NAA: NOT DETECTED
Influenza B, NAA: NOT DETECTED
SARS-CoV-2, NAA: DETECTED — AB

## 2021-06-12 ENCOUNTER — Telehealth: Payer: Self-pay

## 2021-06-12 NOTE — Telephone Encounter (Signed)
Pt called and stated that she changed insurance so, she needs her Losartan and Blisovi moved for [facility] [place]. Pt also stated that the last time she was here she was supposed to get a referral to go to an ENT and she has not heard anything back.

## 2021-06-13 MED ORDER — NORETHIN ACE-ETH ESTRAD-FE 1-20 MG-MCG PO TABS: 1.0000 | ORAL_TABLET | Freq: Every day | ORAL | 11 refills | Status: DC

## 2021-06-13 MED ORDER — LOSARTAN POTASSIUM-HCTZ 100-25 MG PO TABS: 1.0000 | ORAL_TABLET | Freq: Every day | ORAL | 6 refills | Status: DC

## 2021-06-13 NOTE — Telephone Encounter (Signed)
[facility] and hyzaar

## 2021-06-29 DIAGNOSIS — Z0001 Encounter for general adult medical examination with abnormal findings: Secondary | ICD-10-CM | POA: Diagnosis not present

## 2021-06-29 DIAGNOSIS — R7303 Prediabetes: Secondary | ICD-10-CM | POA: Diagnosis not present

## 2021-06-29 DIAGNOSIS — I10 Essential (primary) hypertension: Secondary | ICD-10-CM | POA: Diagnosis not present

## 2021-07-19 ENCOUNTER — Other Ambulatory Visit: Payer: Self-pay

## 2021-07-19 ENCOUNTER — Ambulatory Visit (INDEPENDENT_AMBULATORY_CARE_PROVIDER_SITE_OTHER): Payer: 59 | Admitting: Advanced Practice Midwife

## 2021-07-19 ENCOUNTER — Encounter: Payer: Self-pay | Admitting: Advanced Practice Midwife

## 2021-07-19 ENCOUNTER — Other Ambulatory Visit (HOSPITAL_COMMUNITY)
Admission: RE | Admit: 2021-07-19 | Discharge: 2021-07-19 | Disposition: A | Discharge status: Home / Self Care | Payer: 59 | Source: Ambulatory Visit | Attending: Advanced Practice Midwife | Admitting: Advanced Practice Midwife

## 2021-07-19 VITALS — BP 133/95 | HR 93 | Ht 63.0 in | Wt 290.0 lb

## 2021-07-19 DIAGNOSIS — A749 Chlamydial infection, unspecified: Secondary | ICD-10-CM | POA: Diagnosis not present

## 2021-07-19 DIAGNOSIS — Z113 Encounter for screening for infections with a predominantly sexual mode of transmission: Secondary | ICD-10-CM | POA: Insufficient documentation

## 2021-07-19 DIAGNOSIS — Z01419 Encounter for gynecological examination (general) (routine) without abnormal findings: Secondary | ICD-10-CM

## 2021-07-19 DIAGNOSIS — R69 Illness, unspecified: Secondary | ICD-10-CM | POA: Diagnosis not present

## 2021-07-19 MED ORDER — SLYND 4 MG PO TABS: 1.0000 | ORAL_TABLET | Freq: Every day | ORAL | 12 refills | Status: DC

## 2021-07-19 NOTE — Progress Notes (Signed)
? ?WELL-WOMAN EXAMINATION ?Patient name: BENEDICTA SULTAN MRN [id number]  Date of birth: 1995-09-04 ?Chief Complaint:   ?Gynecologic Exam ? ?History of Present Illness:   ?Joyce Jordan is a 26 y.o. G0P0000 African-American female being seen today for a routine well-woman exam.  ?Current complaints: doing well on COCs and would like to continue; agreeable to GC/chlam collection; Pap due Aug 2024 ? ?PCP: Dr Felecia Shelling      ?does not desire labs ?Patient's last menstrual period was 07/05/2021. ?The current method of family planning is  currently abstinent as SO is [place] of [place]; on OCPs .  ?Last pap Aug 2021. Results were: NILM w/ HRHPV negative. H/O abnormal pap: no ?Last mammogram: never. Results were: N/A. Family h/o breast cancer: no ?Last colonoscopy: never. Results were: N/A. Family h/o colorectal cancer: no (not dx at age <31) ? ? ?  07/19/2021  ?  3:01 PM 12/02/2019  ?  9:23 AM  ?Depression screen PHQ 2/9  ?Decreased Interest 0 0  ?Down, Depressed, Hopeless 1 0  ?PHQ - 2 Score 1 0  ?Altered sleeping 1 3  ?Tired, decreased energy 1 0  ?Change in appetite 0 0  ?Feeling bad or failure about yourself  1 0  ?Trouble concentrating 0 0  ?Moving slowly or fidgety/restless 0 0  ?Suicidal thoughts 1 0  ?PHQ-9 Score 5 3  ?Difficult doing work/chores  Not difficult at all  ? ?  ? ?  07/19/2021  ?  3:01 PM 12/02/2019  ?  9:24 AM  ?GAD 7 : Generalized Anxiety Score  ?Nervous, Anxious, on Edge 1 0  ?Control/stop worrying 0 0  ?Worry too much - different things 1 0  ?Trouble relaxing 0 0  ?Restless 0 0  ?Easily annoyed or irritable 0 0  ?Afraid - awful might happen 0 0  ?Total GAD 7 Score 2 0  ?Anxiety Difficulty  Not difficult at all  ? ? ? ?Review of Systems:   ?Pertinent items are noted in HPI ?Denies any headaches, blurred vision, fatigue, shortness of breath, chest pain, abdominal pain, abnormal vaginal discharge/itching/odor/irritation, problems with periods, bowel movements, urination, or intercourse unless otherwise stated  above. ?Pertinent History Reviewed:  ?Reviewed past medical,surgical, social and family history.  ?Reviewed problem list, medications and allergies. ?Physical Assessment:  ? ?Vitals:  ? 07/19/21 1455  ?BP: (!) 133/95  ?Pulse: 93  ?Weight: 290 lb (131.5 kg)  ?Height: 5\' 3"  (1.6 m)  ?Body mass index is 51.37 kg/m?. ?  ?     Physical Examination:  ? General appearance - well appearing, and in no distress ? Mental status - alert, oriented to person, place, and time ? Psych:  She has a normal mood and affect ? Skin - warm and dry, normal color, no suspicious lesions noted ? Chest - effort normal, all lung fields clear to auscultation bilaterally ? Heart - normal rate and regular rhythm ? Neck:  midline trachea, no thyromegaly or nodules ? Breasts - breasts appear normal, no suspicious masses, no skin or nipple changes or  axillary nodes ? Abdomen - soft, nontender, nondistended, no masses or organomegaly ? Pelvic - VULVA: normal appearing vulva with no masses, tenderness or lesions  VAGINA: normal appearing vagina with normal color and discharge, no lesions  CERVIX: normal appearing cervix without discharge or lesions, no CMT ? Thin prep pap is not done  ? UTERUS: uterus is felt to be normal size, shape, consistency and nontender  ? ADNEXA: No adnexal masses or  tenderness noted. ? Rectal - not examined. Hemoccult: not done ? Extremities:  No swelling or varicosities noted ? ?Chaperone: Malachy Mood   ? ?No results found for this or any previous visit (from the past 24 hour(s)).  ?Assessment & Plan:  ?1) Well-Woman Exam ? ?2) cHTN, on Hyzaar ? ?3) Contraceptive management, is currently on COCs; discussed increased risk of stroke and recommended progestin-only as a less-risky option; she is agreeable so we will change to [facility]; can finish current COC pack and then transition ? ?Labs/procedures today: breast/pelvic exam ? ?Mammogram: @ 27yo, or sooner if problems ?Colonoscopy: @ 26yo, or sooner if problems ? ?No orders of  the defined types were placed in this encounter. ? ? ?Meds:  ?Meds ordered this encounter  ?Medications  ? Drospirenone (SLYND) 4 MG TABS  ?  Sig: Take 1 tablet by mouth daily.  ?  Dispense:  28 tablet  ?  Refill:  12  ?  Order Specific Question:   Supervising Provider  ?  AnswerMyna Hidalgo [[id number]]  ? ? ?Follow-up: Return in about 1 year (around 07/20/2022) for Aug 2024 for pap/physical. ? ?Arabella Merles CNM ?07/19/2021 ?3:29 PM  ?

## 2021-07-21 LAB — CERVICOVAGINAL ANCILLARY ONLY
Bacterial Vaginitis (gardnerella): NEGATIVE
Candida Glabrata: NEGATIVE
Candida Vaginitis: NEGATIVE
Chlamydia: NEGATIVE
Comment: NEGATIVE
Comment: NEGATIVE
Comment: NEGATIVE
Comment: NEGATIVE
Comment: NEGATIVE
Comment: NORMAL
Neisseria Gonorrhea: NEGATIVE
Trichomonas: NEGATIVE

## 2021-07-26 DIAGNOSIS — K141 Geographic tongue: Secondary | ICD-10-CM | POA: Diagnosis not present

## 2021-08-23 DIAGNOSIS — K141 Geographic tongue: Secondary | ICD-10-CM | POA: Diagnosis not present

## 2021-10-04 DIAGNOSIS — K141 Geographic tongue: Secondary | ICD-10-CM | POA: Diagnosis not present

## 2021-10-18 ENCOUNTER — Ambulatory Visit: Payer: 59 | Admitting: Adult Health

## 2021-10-18 ENCOUNTER — Encounter: Payer: Self-pay | Admitting: Adult Health

## 2021-10-18 VITALS — BP 114/78 | HR 90 | Ht 63.0 in | Wt 290.0 lb

## 2021-10-18 DIAGNOSIS — K141 Geographic tongue: Secondary | ICD-10-CM

## 2021-10-18 DIAGNOSIS — Z3202 Encounter for pregnancy test, result negative: Secondary | ICD-10-CM | POA: Diagnosis not present

## 2021-10-18 DIAGNOSIS — I10 Essential (primary) hypertension: Secondary | ICD-10-CM

## 2021-10-18 DIAGNOSIS — R7303 Prediabetes: Secondary | ICD-10-CM | POA: Diagnosis not present

## 2021-10-18 DIAGNOSIS — Z3041 Encounter for surveillance of contraceptive pills: Secondary | ICD-10-CM

## 2021-10-18 DIAGNOSIS — E559 Vitamin D deficiency, unspecified: Secondary | ICD-10-CM | POA: Diagnosis not present

## 2021-10-18 LAB — POCT URINE PREGNANCY: Preg Test, Ur: NEGATIVE

## 2021-10-18 NOTE — Progress Notes (Signed)
  Subjective:     Patient ID: Joyce Jordan, female   DOB: 09/30/1995, 26 y.o.   MRN: [id number]  HPI Joyce Jordan is a 26 year old white female, single, G0P0, had physical with Dr Felecia Shelling today and wants to discuss her birth control, has missed 2 periods.  Lab Results  Component Value Date   DIAGPAP  12/02/2019    - Negative for intraepithelial lesion or malignancy (NILM)   HPVHIGH Negative 12/02/2019   PCP is Dr Felecia Shelling  Review of Systems Has missed periods on Slynd Reviewed past medical,surgical, social and family history. Reviewed medications and allergies.     Objective:   Physical Exam BP 114/78 (BP Location: Right Arm, Patient Position: Sitting, Cuff Size: Normal)   Pulse 90   Ht 5\' 3"  (1.6 m)   Wt 290 lb (131.5 kg)   LMP 08/05/2021   BMI 51.37 kg/m  UPT is negative    Skin warm and dry. Neck: mid line trachea, normal thyroid, good ROM, no lymphadenopathy noted. Lungs: clear to ausculation bilaterally. Cardiovascular: regular rate and rhythm.  Has geographic tongue Assessment:     1. Pregnancy test negative  2. Encounter for surveillance of contraceptive pills Not unusual to miss periods on birth control pills  Continue slynd, has refills   3. Hypertension, unspecified type Continue hyzaar 100-25 mg 1 daily, has refills  4. Morbid obesity (HCC)  5. Geographic tongue Has seen ENT    Plan:     Follow up in 6 months or sooner if needed

## 2021-12-13 ENCOUNTER — Other Ambulatory Visit: Payer: Self-pay | Admitting: Adult Health

## 2022-01-22 ENCOUNTER — Other Ambulatory Visit: Payer: Self-pay | Admitting: *Deleted

## 2022-01-22 MED ORDER — SLYND 4 MG PO TABS: 1.0000 | ORAL_TABLET | Freq: Every day | ORAL | 12 refills | Status: DC

## 2022-05-07 ENCOUNTER — Ambulatory Visit: Payer: 59 | Admitting: Adult Health

## 2022-05-28 ENCOUNTER — Encounter: Payer: Self-pay | Admitting: Adult Health

## 2022-05-28 ENCOUNTER — Ambulatory Visit (INDEPENDENT_AMBULATORY_CARE_PROVIDER_SITE_OTHER): Payer: 59 | Admitting: Adult Health

## 2022-05-28 ENCOUNTER — Other Ambulatory Visit (HOSPITAL_COMMUNITY)
Admission: RE | Admit: 2022-05-28 | Discharge: 2022-05-28 | Disposition: A | Discharge status: Home / Self Care | Payer: 59 | Source: Ambulatory Visit | Attending: Adult Health | Admitting: Adult Health

## 2022-05-28 VITALS — BP 127/84 | HR 86 | Ht 63.5 in | Wt 275.0 lb

## 2022-05-28 DIAGNOSIS — I10 Essential (primary) hypertension: Secondary | ICD-10-CM | POA: Diagnosis not present

## 2022-05-28 DIAGNOSIS — Z124 Encounter for screening for malignant neoplasm of cervix: Secondary | ICD-10-CM | POA: Diagnosis present

## 2022-05-28 DIAGNOSIS — Z3202 Encounter for pregnancy test, result negative: Secondary | ICD-10-CM | POA: Diagnosis not present

## 2022-05-28 DIAGNOSIS — Z01419 Encounter for gynecological examination (general) (routine) without abnormal findings: Secondary | ICD-10-CM | POA: Diagnosis not present

## 2022-05-28 DIAGNOSIS — Z3041 Encounter for surveillance of contraceptive pills: Secondary | ICD-10-CM | POA: Diagnosis not present

## 2022-05-28 LAB — POCT URINE PREGNANCY: Preg Test, Ur: NEGATIVE

## 2022-05-28 MED ORDER — SLYND 4 MG PO TABS: 1.0000 | ORAL_TABLET | Freq: Every day | ORAL | 12 refills | Status: DC

## 2022-05-28 MED ORDER — LOSARTAN POTASSIUM-HCTZ 100-25 MG PO TABS: 1.0000 | ORAL_TABLET | Freq: Every day | ORAL | 12 refills | Status: DC

## 2022-05-28 NOTE — Progress Notes (Signed)
Patient ID: Joyce Jordan, female   DOB: 09/25/1995, 27 y.o.   MRN: [id number] History of Present Illness: Joyce Jordan is a 27 year old black female,single, G0P0, in for refill on slynd and BP check, and pelvic exam. Had itching from flea infestation from dog, had to remove carpet. She wants a pap today.   PCP is Dr Legrand Rams.   Current Medications, Allergies, Past Medical History, Past Surgical History, Family History and Social History were reviewed in [employer] record.     Review of Systems:  Patient denies any headaches, hearing loss, fatigue, blurred vision, shortness of breath, chest pain, abdominal pain, problems with bowel movements, urination, or intercourse. No joint pain or mood swings.  Happy with slynd.  Physical Exam:BP 127/84 (BP Location: Right Arm, Patient Position: Sitting, Cuff Size: Normal)   Pulse 86   Ht 5' 3.5" (1.613 m)   Wt 275 lb (124.7 kg)   LMP 05/15/2022 (Approximate)   BMI 47.95 kg/m  UPT is negative  General:  Well developed, well nourished, no acute distress Skin:  Warm and dry Neck:  Midline trachea, normal thyroid, good ROM, no lymphadenopathy Lungs; Clear to auscultation bilaterally Breast:  No dominant palpable mass, retraction, or nipple discharge, has scarring from scratching Cardiovascular: Regular rate and rhythm Abdomen:  Soft, non tender, no hepatosplenomegaly Pelvic:  External genitalia is normal in appearance, no lesions.  The vagina is normal in appearance. Urethra has no lesions or masses. The cervix is smooth, pap with GC/CHL and HR HPV genotyping performed.  Uterus is felt to be normal size, shape, and contour.  No adnexal masses or tenderness noted.Bladder is non tender, no masses felt. Extremities/musculoskeletal:  No swelling or varicosities noted, no clubbing or cyanosis Psych:  No mood changes, alert and cooperative,seems happy AA is 6 Fall risk is low    05/28/2022   11:51 AM 07/19/2021    3:01 PM 12/02/2019     9:23 AM  Depression screen PHQ 2/9  Decreased Interest 0 0 0  Down, Depressed, Hopeless 1 1 0  PHQ - 2 Score 1 1 0  Altered sleeping 2 1 3   Tired, decreased energy 1 1 0  Change in appetite 3 0 0  Feeling bad or failure about yourself  1 1 0  Trouble concentrating 0 0 0  Moving slowly or fidgety/restless 0 0 0  Suicidal thoughts 1 1 0  PHQ-9 Score 9 5 3   Difficult doing work/chores   Not difficult at all       05/28/2022   11:51 AM 07/19/2021    3:01 PM 12/02/2019    9:24 AM  GAD 7 : Generalized Anxiety Score  Nervous, Anxious, on Edge 0 1 0  Control/stop worrying 1 0 0  Worry too much - different things 1 1 0  Trouble relaxing 0 0 0  Restless 0 0 0  Easily annoyed or irritable 0 0 0  Afraid - awful might happen 0 0 0  Total GAD 7 Score 2 2 0  Anxiety Difficulty   Not difficult at all      Upstream - 05/28/22 1149       Pregnancy Intention Screening   Does the patient want to become pregnant in the next year? No    Does the patient's partner want to become pregnant in the next year? No    Would the patient like to discuss contraceptive options today? No      Contraception Wrap Up   Current  Method Oral Contraceptive    End Method Oral Contraceptive            Examination chaperoned by Levy Pupa LPN   Impression and plan: 1. Pregnancy examination or test, negative result - POCT urine pregnancy  2. Routine Papanicolaou smear Pap sent Pap in 3 years if normal - Cytology - PAP( [place])  3. Encounter for surveillance of contraceptive pills Happy with slynd Meds ordered this encounter  Medications   losartan-hydrochlorothiazide (HYZAAR) 100-25 MG tablet    Sig: Take 1 tablet by mouth daily.    Dispense:  30 tablet    Refill:  12    Order Specific Question:   Supervising Provider    Answer:   Tania Ade H [[id number]]   Drospirenone (SLYND) 4 MG TABS    Sig: Take 1 tablet (4 mg total) by mouth daily.    Dispense:  28 tablet    Refill:  12    Order  Specific Question:   Supervising Provider    Answer:   Elonda Husky, LUTHER H [[id number]]     4. Hypertension, unspecified type Will refill hyzaar 100-25 mg 1 daily  She had labs with PCP, but has not heard back, will call them   5. Morbid obesity ([place]) Stay active   6. Normal pelvic exam No pain on exam,discharge or order Pap sent

## 2022-05-30 LAB — CYTOLOGY - PAP
Chlamydia: NEGATIVE
Comment: NEGATIVE
Comment: NEGATIVE
Comment: NORMAL
Diagnosis: NEGATIVE
High risk HPV: NEGATIVE
Neisseria Gonorrhea: NEGATIVE

## 2022-06-25 ENCOUNTER — Ambulatory Visit: Payer: 59 | Admitting: Adult Health

## 2022-07-12 DIAGNOSIS — Z0000 Encounter for general adult medical examination without abnormal findings: Secondary | ICD-10-CM | POA: Diagnosis not present

## 2022-07-12 DIAGNOSIS — R5383 Other fatigue: Secondary | ICD-10-CM | POA: Diagnosis not present

## 2022-10-24 DIAGNOSIS — I10 Essential (primary) hypertension: Secondary | ICD-10-CM | POA: Diagnosis not present

## 2022-10-24 DIAGNOSIS — Z299 Encounter for prophylactic measures, unspecified: Secondary | ICD-10-CM | POA: Diagnosis not present

## 2022-11-12 DIAGNOSIS — Z6841 Body Mass Index (BMI) 40.0 and over, adult: Secondary | ICD-10-CM | POA: Diagnosis not present

## 2022-11-12 DIAGNOSIS — I10 Essential (primary) hypertension: Secondary | ICD-10-CM | POA: Diagnosis not present

## 2022-11-12 DIAGNOSIS — W57XXXA Bitten or stung by nonvenomous insect and other nonvenomous arthropods, initial encounter: Secondary | ICD-10-CM | POA: Diagnosis not present

## 2022-11-12 DIAGNOSIS — Z299 Encounter for prophylactic measures, unspecified: Secondary | ICD-10-CM | POA: Diagnosis not present

## 2022-11-16 DIAGNOSIS — S00261A Insect bite (nonvenomous) of right eyelid and periocular area, initial encounter: Secondary | ICD-10-CM | POA: Diagnosis not present

## 2022-11-16 DIAGNOSIS — W57XXXA Bitten or stung by nonvenomous insect and other nonvenomous arthropods, initial encounter: Secondary | ICD-10-CM | POA: Diagnosis not present

## 2022-12-21 DIAGNOSIS — R6889 Other general symptoms and signs: Secondary | ICD-10-CM | POA: Diagnosis not present

## 2022-12-21 DIAGNOSIS — Z299 Encounter for prophylactic measures, unspecified: Secondary | ICD-10-CM | POA: Diagnosis not present

## 2022-12-21 DIAGNOSIS — J029 Acute pharyngitis, unspecified: Secondary | ICD-10-CM | POA: Diagnosis not present

## 2023-06-07 ENCOUNTER — Encounter: Payer: Self-pay | Admitting: Adult Health

## 2023-06-07 ENCOUNTER — Ambulatory Visit (INDEPENDENT_AMBULATORY_CARE_PROVIDER_SITE_OTHER): Payer: 59 | Admitting: Adult Health

## 2023-06-07 VITALS — BP 102/79 | HR 91 | Ht 63.0 in | Wt 248.5 lb

## 2023-06-07 DIAGNOSIS — L0292 Furuncle, unspecified: Secondary | ICD-10-CM | POA: Diagnosis not present

## 2023-06-07 DIAGNOSIS — Z3041 Encounter for surveillance of contraceptive pills: Secondary | ICD-10-CM | POA: Diagnosis not present

## 2023-06-07 DIAGNOSIS — F32A Depression, unspecified: Secondary | ICD-10-CM

## 2023-06-07 DIAGNOSIS — Z01419 Encounter for gynecological examination (general) (routine) without abnormal findings: Secondary | ICD-10-CM | POA: Diagnosis not present

## 2023-06-07 DIAGNOSIS — Z1331 Encounter for screening for depression: Secondary | ICD-10-CM | POA: Diagnosis not present

## 2023-06-07 DIAGNOSIS — I10 Essential (primary) hypertension: Secondary | ICD-10-CM

## 2023-06-07 DIAGNOSIS — F419 Anxiety disorder, unspecified: Secondary | ICD-10-CM | POA: Diagnosis not present

## 2023-06-07 MED ORDER — SILVER SULFADIAZINE 1 % EX CREA: 1.0000 | TOPICAL_CREAM | Freq: Two times a day (BID) | CUTANEOUS | 0 refills | Status: DC

## 2023-06-07 MED ORDER — SLYND 4 MG PO TABS: 1.0000 | ORAL_TABLET | Freq: Every day | ORAL | 12 refills | Status: DC

## 2023-06-07 MED ORDER — SULFAMETHOXAZOLE-TRIMETHOPRIM 800-160 MG PO TABS: 1.0000 | ORAL_TABLET | Freq: Two times a day (BID) | ORAL | 0 refills | Status: DC

## 2023-06-07 MED ORDER — LOSARTAN POTASSIUM-HCTZ 100-25 MG PO TABS: 1.0000 | ORAL_TABLET | Freq: Every day | ORAL | 12 refills | Status: AC

## 2023-06-07 MED ORDER — ESCITALOPRAM OXALATE 10 MG PO TABS: 10.0000 mg | ORAL_TABLET | Freq: Every day | ORAL | 3 refills | Status: DC

## 2023-06-07 NOTE — Progress Notes (Signed)
 Patient ID: Joyce Jordan, female   DOB: [birth year], 28 y.o.   MRN: [id number] History of Present Illness:  Joyce Jordan is a 28 year old black female,single, G0P0, in for a well woman gyn exam.    Component Value Date/Time   DIAGPAP  05/28/2022 1154    - Negative for intraepithelial lesion or malignancy (NILM)   DIAGPAP  12/02/2019 0920    - Negative for intraepithelial lesion or malignancy (NILM)   HPVHIGH Negative 05/28/2022 1154   HPVHIGH Negative 12/02/2019 0920   ADEQPAP  05/28/2022 1154    Satisfactory for evaluation; transformation zone component PRESENT.   ADEQPAP  12/02/2019 0920    Satisfactory for evaluation; transformation zone component PRESENT.    PCP is [facility]  Current Medications, Allergies, Past Medical History, Past Surgical History, Family History and Social History were reviewed in [employer] record.     Review of Systems: Patient denies any headaches, hearing loss, fatigue, blurred vision, shortness of breath, chest pain, abdominal pain, problems with bowel movements, urination, or intercourse(not currently active). No joint pain or mood swings.     Physical Exam:BP 102/79 (BP Location: Left Arm, Patient Position: Sitting, Cuff Size: Large)   Pulse 91   Ht 5' 3 (1.6 m)   Wt 248 lb 8 oz (112.7 kg)   BMI 44.02 kg/m   General:  Well developed, well nourished, no acute distress Skin:  Warm and dry Neck:  Midline trachea, normal thyroid, good ROM, no lymphadenopathy Lungs; Clear to auscultation bilaterally Breast:  No dominant palpable mass, retraction, or nipple discharge Cardiovascular: Regular rate and rhythm Abdomen:  Soft, non tender, no hepatosplenomegaly Pelvic:  External genitalia is normal in appearance, is has a 2 cm boil on mons pubis, it is tender.  The vagina is normal in appearance. Urethra has no lesions or masses. The cervix is smooth.  Uterus is felt to be normal size, shape, and contour.  No adnexal masses or tenderness  noted.Bladder is non tender, no masses felt. Extremities/musculoskeletal:  No swelling or varicosities noted, no clubbing or cyanosis Psych:  No mood changes, alert and cooperative,seems happy AA is 4 Fall risk is low    06/07/2023    8:32 AM 05/28/2022   11:51 AM 07/19/2021    3:01 PM  Depression screen PHQ 2/9  Decreased Interest 1 0 0  Down, Depressed, Hopeless 3 1 1   PHQ - 2 Score 4 1 1   Altered sleeping 2 2 1   Tired, decreased energy 2 1 1   Change in appetite 0 3 0  Feeling bad or failure about yourself  3 1 1   Trouble concentrating 0 0 0  Moving slowly or fidgety/restless 0 0 0  Suicidal thoughts 1 1 1   PHQ-9 Score 12 9 5        06/07/2023    8:32 AM 05/28/2022   11:51 AM 07/19/2021    3:01 PM 12/02/2019    9:24 AM  GAD 7 : Generalized Anxiety Score  Nervous, Anxious, on Edge 1 0 1 0  Control/stop worrying 1 1 0 0  Worry too much - different things 2 1 1  0  Trouble relaxing 1 0 0 0  Restless 0 0 0 0  Easily annoyed or irritable 2 0 0 0  Afraid - awful might happen 0 0 0 0  Total GAD 7 Score 7 2 2  0  Anxiety Difficulty    Not difficult at all    Upstream - 06/07/23 9164  Pregnancy Intention Screening   Does the patient want to become pregnant in the next year? No    Does the patient's partner want to become pregnant in the next year? No    Would the patient like to discuss contraceptive options today? No      Contraception Wrap Up   Current Method Abstinence;Oral Contraceptive    End Method Oral Contraceptive    Contraception Counseling Provided Yes              Examination chaperoned by Clarita Salt LPN  Impression and plan: 1. Encounter for well woman exam with routine gynecological exam (Primary) Phyislcal in 1 year Pap in 2027 Labs with PCP   2. Encounter for surveillance of contraceptive pills Happy with slynd , will refill  3. Hypertension, unspecified type BP is good, will refill hyzaar 100-25 mg 1 daily  4. Boil Has 2 cm tender boil, mons  pubis, it is not red  Will rx septra  ds 1 bid x 14 days and silvadene  Use warm compress and do not squeeze  5. Anxiety and depression She is interested in meds, but declines [facility] referral at this time Will rx lexapro  10 mg 1 daily and follow up in 8 weeks  Meds ordered this encounter  Medications   sulfamethoxazole -trimethoprim  (BACTRIM  DS) 800-160 MG tablet    Sig: Take 1 tablet by mouth 2 (two) times daily. Take 1 bid    Dispense:  28 tablet    Refill:  0    Supervising Provider:   JAYNE MINDER H [[id number]]   silver  sulfADIAZINE  (SILVADENE ) 1 % cream    Sig: Apply 1 Application topically 2 (two) times daily.    Dispense:  50 g    Refill:  0    Supervising Provider:   JAYNE MINDER H [[id number]]   Drospirenone  (SLYND ) 4 MG TABS    Sig: Take 1 tablet (4 mg total) by mouth daily.    Dispense:  28 tablet    Refill:  12    Supervising Provider:   JAYNE MINDER H [[id number]]   losartan -hydrochlorothiazide (HYZAAR) 100-25 MG tablet    Sig: Take 1 tablet by mouth daily.    Dispense:  30 tablet    Refill:  12    Supervising Provider:   JAYNE MINDER H [[id number]]   escitalopram  (LEXAPRO ) 10 MG tablet    Sig: Take 1 tablet (10 mg total) by mouth daily.    Dispense:  30 tablet    Refill:  3    Supervising Provider:   JAYNE MINDER H [[id number]]

## 2023-06-30 ENCOUNTER — Other Ambulatory Visit: Payer: Self-pay | Admitting: Adult Health

## 2023-07-23 DIAGNOSIS — Z0000 Encounter for general adult medical examination without abnormal findings: Secondary | ICD-10-CM | POA: Diagnosis not present

## 2023-07-23 DIAGNOSIS — R5383 Other fatigue: Secondary | ICD-10-CM | POA: Diagnosis not present

## 2023-08-02 ENCOUNTER — Encounter: Payer: Self-pay | Admitting: Adult Health

## 2023-08-02 ENCOUNTER — Ambulatory Visit: Payer: 59 | Admitting: Adult Health

## 2023-08-02 VITALS — BP 117/88 | HR 84 | Ht 63.0 in | Wt 246.0 lb

## 2023-08-02 DIAGNOSIS — F419 Anxiety disorder, unspecified: Secondary | ICD-10-CM | POA: Diagnosis not present

## 2023-08-02 DIAGNOSIS — I10 Essential (primary) hypertension: Secondary | ICD-10-CM | POA: Diagnosis not present

## 2023-08-02 DIAGNOSIS — Z1331 Encounter for screening for depression: Secondary | ICD-10-CM

## 2023-08-02 DIAGNOSIS — F32A Depression, unspecified: Secondary | ICD-10-CM

## 2023-08-02 MED ORDER — SERTRALINE HCL 50 MG PO TABS: 50.0000 mg | ORAL_TABLET | Freq: Every day | ORAL | 3 refills | Status: DC

## 2023-08-02 NOTE — Progress Notes (Signed)
 Subjective:     Patient ID: Joyce Jordan, female   DOB: January 26, 1996, 28 y.o.   MRN: [id number]  HPI Joyce Jordan is a 28 year old black female,single, G0P0, back in follow up on starting lexapro but stopped after 1 month, it was not helping she days, and check BP.     Component Value Date/Time   DIAGPAP  05/28/2022 1154    - Negative for intraepithelial lesion or malignancy (NILM)   DIAGPAP  12/02/2019 0920    - Negative for intraepithelial lesion or malignancy (NILM)   HPVHIGH Negative 05/28/2022 1154   HPVHIGH Negative 12/02/2019 0920   ADEQPAP  05/28/2022 1154    Satisfactory for evaluation; transformation zone component PRESENT.   ADEQPAP  12/02/2019 0920    Satisfactory for evaluation; transformation zone component PRESENT.    PCP is [facility]  Review of Systems Still with some depression No period on slynd Reviewed past medical,surgical, social and family history. Reviewed medications and allergies.     Objective:   Physical Exam BP 117/88 (BP Location: Right Arm, Patient Position: Sitting, Cuff Size: Large)   Pulse 84   Ht 5\' 3"  (1.6 m)   Wt 246 lb (111.6 kg)   BMI 43.58 kg/m     Skin warm and dry.  Lungs: clear to ausculation bilaterally. Cardiovascular: regular rate and rhythm.    08/02/2023    9:13 AM 06/07/2023    8:32 AM 05/28/2022   11:51 AM  Depression screen PHQ 2/9  Decreased Interest 1 1 0  Down, Depressed, Hopeless 1 3 1   PHQ - 2 Score 2 4 1   Altered sleeping 1 2 2   Tired, decreased energy 1 2 1   Change in appetite 1 0 3  Feeling bad or failure about yourself  2 3 1   Trouble concentrating 0 0 0  Moving slowly or fidgety/restless 0 0 0  Suicidal thoughts 2 1 1   PHQ-9 Score 9 12 9         08/02/2023    9:14 AM 06/07/2023    8:32 AM 05/28/2022   11:51 AM 07/19/2021    3:01 PM  GAD 7 : Generalized Anxiety Score  Nervous, Anxious, on Edge 0 1 0 1  Control/stop worrying 0 1 1 0  Worry too much - different things 1 2 1 1   Trouble relaxing 1 1 0 0  Restless 0 0 0  0  Easily annoyed or irritable 2 2 0 0  Afraid - awful might happen 0 0 0 0  Total GAD 7 Score 4 7 2 2     Upstream - 08/02/23 0911       Pregnancy Intention Screening   Does the patient want to become pregnant in the next year? No    Does the patient's partner want to become pregnant in the next year? No    Would the patient like to discuss contraceptive options today? No      Contraception Wrap Up   Current Method Oral Contraceptive    End Method Oral Contraceptive    Contraception Counseling Provided Yes               Assessment:     1. Anxiety and depression (Primary) +depression still, anxiety better, but stopped lexapro  Will rx zoloft 50 mg 1 daily Meds ordered this encounter  Medications   sertraline (ZOLOFT) 50 MG tablet    Sig: Take 1 tablet (50 mg total) by mouth daily.    Dispense:  30 tablet  Refill:  3    Supervising Provider:   Duane Lope H [[id number]]     2. Hypertension, unspecified type Continue meds, follow up with PCP    Plan:     Follow up in 8 weeks for ROS

## 2023-08-24 ENCOUNTER — Other Ambulatory Visit: Payer: Self-pay | Admitting: Adult Health

## 2023-09-25 ENCOUNTER — Encounter: Payer: Self-pay | Admitting: Adult Health

## 2023-09-25 ENCOUNTER — Ambulatory Visit: Admitting: Adult Health

## 2023-09-25 VITALS — BP 110/75 | HR 90 | Ht 63.0 in | Wt 240.5 lb

## 2023-09-25 DIAGNOSIS — F32A Depression, unspecified: Secondary | ICD-10-CM

## 2023-09-25 DIAGNOSIS — L0292 Furuncle, unspecified: Secondary | ICD-10-CM | POA: Diagnosis not present

## 2023-09-25 DIAGNOSIS — F419 Anxiety disorder, unspecified: Secondary | ICD-10-CM

## 2023-09-25 DIAGNOSIS — Z1331 Encounter for screening for depression: Secondary | ICD-10-CM

## 2023-09-25 MED ORDER — SULFAMETHOXAZOLE-TRIMETHOPRIM 800-160 MG PO TABS: 1.0000 | ORAL_TABLET | Freq: Two times a day (BID) | ORAL | 0 refills | Status: AC

## 2023-09-25 MED ORDER — SERTRALINE HCL 25 MG PO TABS: ORAL_TABLET | ORAL | 3 refills | Status: DC

## 2023-09-25 NOTE — Progress Notes (Signed)
 Subjective:     Patient ID: Joyce Jordan, female   DOB: 07/20/1995, 28 y.o.   MRN: [id number]  HPI Joyce Jordan is a 28 year old black female,single, G0P0, back in follow up on taking zoloft  50 mg 1 daily and does not see much difference. She has ?boil right breast too.     Component Value Date/Time   DIAGPAP  05/28/2022 1154    - Negative for intraepithelial lesion or malignancy (NILM)   DIAGPAP  12/02/2019 0920    - Negative for intraepithelial lesion or malignancy (NILM)   HPVHIGH Negative 05/28/2022 1154   HPVHIGH Negative 12/02/2019 0920   ADEQPAP  05/28/2022 1154    Satisfactory for evaluation; transformation zone component PRESENT.   ADEQPAP  12/02/2019 0920    Satisfactory for evaluation; transformation zone component PRESENT.    PCP is [facility]  Review of Systems +anxiety and depression +boil right breast Reviewed past medical,surgical, social and family history. Reviewed medications and allergies.     Objective:   Physical Exam BP 110/75 (BP Location: Left Arm, Patient Position: Sitting, Cuff Size: Large)   Pulse 90   Ht 5\' 3"  (1.6 m)   Wt 240 lb 8 oz (109.1 kg)   BMI 42.60 kg/m     Skin warm and dry. Lungs: clear to ausculation bilaterally. Cardiovascular: regular rate and rhythm.  Has 1 cm boil right breast at about 7 0'clock.  Fall risk is low    09/25/2023    2:10 PM 08/02/2023    9:13 AM 06/07/2023    8:32 AM  Depression screen PHQ 2/9  Decreased Interest 1 1 1   Down, Depressed, Hopeless 2 1 3   PHQ - 2 Score 3 2 4   Altered sleeping 1 1 2   Tired, decreased energy 1 1 2   Change in appetite 0 1 0  Feeling bad or failure about yourself  2 2 3   Trouble concentrating 0 0 0  Moving slowly or fidgety/restless 0 0 0  Suicidal thoughts 2 2 1   PHQ-9 Score 9 9 12   Difficult doing work/chores Somewhat difficult         09/25/2023    2:11 PM 08/02/2023    9:14 AM 06/07/2023    8:32 AM 05/28/2022   11:51 AM  GAD 7 : Generalized Anxiety Score  Nervous, Anxious, on Edge 1  0 1 0  Control/stop worrying 1 0 1 1  Worry too much - different things 2 1 2 1   Trouble relaxing 1 1 1  0  Restless 1 0 0 0  Easily annoyed or irritable 2 2 2  0  Afraid - awful might happen 0 0 0 0  Total GAD 7 Score 8 4 7 2     Upstream - 09/25/23 1415       Pregnancy Intention Screening   Does the patient want to become pregnant in the next year? No    Does the patient's partner want to become pregnant in the next year? No    Would the patient like to discuss contraceptive options today? No      Contraception Wrap Up   Current Method Oral Contraceptive    End Method Oral Contraceptive    Contraception Counseling Provided Yes               Assessment:     1. Anxiety and depression (Primary) Not seeing much change with 50 mg of zoloft  will increase to 75 mg  Meds ordered this encounter  Medications   sulfamethoxazole -trimethoprim  (BACTRIM   DS) 800-160 MG tablet    Sig: Take 1 tablet by mouth 2 (two) times daily. Take 1 bid    Dispense:  28 tablet    Refill:  0    Supervising Provider:   Evalyn Hillier H [[id number]]   sertraline  (ZOLOFT ) 25 MG tablet    Sig: Take 3  25 mg tablets daily    Dispense:  90 tablet    Refill:  3    Supervising Provider:   Evalyn Hillier H [[id number]]     2. Boil Has boil right breast Will rx septra  ds 1 bid x 14 days Do not squeeze Can use silvadene  on it Stop shower gel and use antibacterial bar soap     Plan:     Follow up in about 7 weeks for ROS

## 2023-09-27 ENCOUNTER — Ambulatory Visit: Admitting: Adult Health

## 2023-10-17 ENCOUNTER — Other Ambulatory Visit: Payer: Self-pay | Admitting: Adult Health

## 2023-11-13 ENCOUNTER — Ambulatory Visit: Admitting: Adult Health

## 2024-01-16 ENCOUNTER — Other Ambulatory Visit: Payer: Self-pay | Admitting: Adult Health

## 2024-05-21 ENCOUNTER — Other Ambulatory Visit: Payer: Self-pay | Admitting: Adult Health

## 2024-05-29 ENCOUNTER — Other Ambulatory Visit: Payer: Self-pay | Admitting: Adult Health

## 2024-07-01 ENCOUNTER — Ambulatory Visit: Admitting: Obstetrics and Gynecology
